# Patient Record
Sex: Male | Born: 1960
Health system: Western US, Academic
[De-identification: ages and names within clinical notes are randomized; demographics above are authoritative.]

---

## 2020-05-23 ENCOUNTER — Inpatient Hospital Stay: Payer: PRIVATE HEALTH INSURANCE

## 2020-05-23 DIAGNOSIS — C61 Malignant neoplasm of prostate: Secondary | ICD-10-CM

## 2020-05-28 ENCOUNTER — Telehealth: Payer: BLUE CROSS/BLUE SHIELD

## 2020-05-28 NOTE — Telephone Encounter
Intake complete.  Timothy Merritt 05/28/2020 10:03 AM

## 2020-05-29 NOTE — Consults
RADIATION ONCOLOGY PROSTATE DEFINITIVE NEW PATIENT VISIT NOTE    ???PATIENT: Timothy Merritt  ???MRN: [id number]  ???DOB: Feb 01, 1961    ???DATE OF SERVICE: 05/31/2020    ???REFERRING PRACTITIONER: Fraser Din, MD  ???PRIMARY CARE PROVIDER: No primary care provider on file.  ???RESIDENT PHYSICIAN: Carl Best, MD   ???ATTENDING PHYSICIAN: Casimiro Needle L. Ria Comment, MD     ???CHIEF COMPLAINT: ***    ???IDENTIFYING DATA: ***    ???STAGE: Cancer Staging  No matching staging information was found for the patient.     ???DATA OF PRESENT ILLNESS:  ???   Prostate Data Fields ~ Data ~ Comments    ??? Most Recent PSA level ~  ~     ??? Most Recent PSA level date (m/d/yyyy) ~  ~     ??? Biopsy date - most recent (m/d/yyyy) ~  ~     ??? Biopsy Gleason Score 1 ~  ~     ??? Biopsy Gleason Score 2 ~  ~     ??? Biopsy Gleason Score Total (1+2) ~  ~     ??? Total biopsy cores ~  ~     ??? Positive biopsy cores  ~  ~     ??? Bone Scan (recent) ~ {  :24337::''----''} ~     ??? MRI Pelvis (recent) ~ {  :24337::''----''} ~     ??? CT Scan Pelvis (recent) ~ {  :24337::''----''} ~  ???     Subjective:       History of Present Illness:     Timothy Merritt is a 59 y.o. male who was in his usual state of health when his PSA was found to be elevated to *** on ***.    Had HIFU (left hemi-ablation) on 03/17/2018.    Following HIFU, his PSA was found to be elevated to 4.94 on 11/04/2018.    By 07/07/2019, his PSA had risen to 10.45.    mpMRI performed at [facility] revealed:  Impression:  1. ???Post-treatment changes in the left posterior gland without evidence of residual disease in this location.  2. ???PI-RADS 5 lesion in the anterior transition zone at the base/mid gland extending into the anterior fibromuscular stroma. Volume of 3.27 mL. Broad-based capsular abutment anteriorly is concerning for microscopic extracapsular extension. Recommend correlation with outside prior MRI to  evaluate for interval change.  3. ???No lymphadenopathy or osseous metastatic disease in the pelvis.    MRI-guided fusion biopsy on 07/07/2019 at [facility] demonstrated Gleason 3+4=7 disease in 4/4 targeted cores, Gleason 3+4=7 in the right lateral base, and Gleason 3+3=6 disease in four additional bilateral cores.    mpMRI performed at another outside hospital on 04/07/2020 revealed:  1. No evidence high-grade prostate cancer local recurrence within the prostate gland.  2. No evidence of extracapsular local prostate cancer or prostate cancer metastasis within the pelvis.  3. Asymmetric volumes of the peripheral with volume loss and cystic change in the in the LEFT peripheral zone presumably related to prior HIFU therapy.    The scan was over-read at [facility] as follows: The scan was significantly limited in quality, but there appeared to be a large, invasive mass replacing most of the prostate gland, extending into the bilateral seminal vesicles, very suspicious for locally advanced prostate cancer, likely stage T3b.    Today in clinic, he reports ***. The patient denies frequency, urgency, dysuria, hematuria, urinary incontinence, loose stools, tenesmus, rectal bleeding, fatigue, or significant changes in erectile function.  CTCAE Pelvis    Review of Systems: A 14 point review of systems questionnaire was given to the patient and reviewed by the attending physician. Signed copies are available in the chart.    Current Outpatient Medications   Medication Sig   ??? rivaroxaban 10 mg tablet Take 10 mg by mouth.     No current facility-administered medications for this encounter.      No Known Allergies  PERTINENT PAST MEDICAL HISTORY:   Past Medical History:   Diagnosis Date   ??? Prostate cancer (HCC/RAF)    ??? Stroke (HCC/RAF) 2018     PAST SURGICAL HISTORY: History reviewed. No pertinent surgical history.   PREVIOUS MALIGNANCY: ***  PREVIOUS RADIATION THERAPY:***  PREVIOUS CHEMOTHERAPY: ***    Radiation Oncology 3Ps  Do you have a pacemaker or implantable defibrillator?: No  Is it possible that you are pregnant?: No  Have you previously received radiation treatment?: No    Family History   Problem Relation Age of Onset   ??? Skin cancer Father    ??? Liver cancer Brother      Social History     Tobacco Use   ??? Smoking status: Never Smoker   ??? Smokeless tobacco: Never Used   Substance Use Topics   ??? Alcohol use: Yes     Alcohol/week: 3.6 - 4.8 oz     Types: 6 - 8 Glasses of Wine (5 oz) per week   ??? Drug use: Not Currently       Marital Status: ***  Lives with: [employer] of Residence: ***  Occupation: ***  Able to perform activities of daily living: ***         Objective:      Physical Exam:  Ht 6' (1.829 m)  ~ Wt 171 lb (77.6 kg) Comment: Pt states ~ BMI 23.19 kg/m???      {ECOG - [facility] performance status:29211}     Physical Exam  Lab Review / Pathology / Radiology:    Biopsy 07/07/2019:  A. 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  Carcinoma involves 6 mm of 15 mm aggregate biopsy length.   Carcinoma involves 1 of 1 core.      B. Prostate, right lateral mid, needle core biopsy:  Prostatic adenocarcinoma, Gleason pattern 3 + 3 = 6 (Grade Group 1).  Carcinoma involves 2 mm of 14 mm aggregate biopsy length.   Carcinoma involves 1 of 1 core.      C. Prostate, right lateral apex, needle core biopsy:  Prostatic adenocarcinoma, Gleason pattern 3 + 3 = 6 (Grade Group 1).  Carcinoma involves 5 mm of 8 mm aggregate biopsy length.   Carcinoma involves 1 of 1 core.      D. Prostate, left lateral base, needle core biopsy:  Prostatic adenocarcinoma, Gleason pattern 3 + 3 = 6 (Grade Group 1).  Carcinoma involves 1 mm of 11 mm aggregate biopsy length.   Carcinoma involves 1 of 1 core.      E. Prostate, left lateral mid, needle core biopsy:  Prostatic adenocarcinoma, Gleason pattern 3 + 3 = 6 (Grade Group 1).  Carcinoma involves 1 mm of 11 mm aggregate biopsy length.   Carcinoma involves 1 of 1 core.      F. Prostate, left lateral apex, needle core biopsy: Benign prostate tissue.      G. Prostate, lesion 1, needle core  biopsy:  Prostatic adenocarcinoma, Gleason pattern 3 + 4 (10%) = 7 (Grade Group 2).  Carcinoma involves 9 mm of 69 mm aggregate biopsy length.   Carcinoma involves 4 of 4 cores.       Assessment:      ***     Plan/ Recommendation:      We reviewed the patient???s detailed clinical history, imaging and pathology.    We discussed the nature and rationale of radiotherapy for the treatment of prostate cancer. We reviewed the radiotherapy side effects that can occur during treatment and within a few months after completing treatment. These include but are not limited to mild fatigue, bladder and urethra irritation symptoms of frequency and urgency, mild burning with urination, a small risk of tiny amounts of bleeding in the urine, and the rectum and bowel irritation symptoms of slightly looser, urgent and more frequent bowel movements, and possible small amounts of bleeding from hemorrhoids. These side effects are generally mild, are easily managed during treatment, and gradually resolve within several weeks after completion of treatment. We discussed that there is a very small risk that some side effects can persist for many months but that they resolve in the vast majority of patients. We discussed that erectile dysfunction after radiotherapy can occur in up to half of patients within 5 years after treatment. We discussed that the risk of urinary incontinence or bowel incontinence are extremely rare.  Lastly, we reviewed the likelihood of treatment success with radiotherapy, as well as the potential alternative treatments and the consequences of not proceeding with any treatment.    {Select Low or Moderate or High MDM; and/or TIME. Time required for Non-Medicare:35252}           cc No care team member to display      Author: Carl Best 05/29/2020 12:22 PM

## 2020-05-31 ENCOUNTER — Telehealth: Payer: BLUE CROSS/BLUE SHIELD

## 2020-06-01 ENCOUNTER — Inpatient Hospital Stay: Payer: BLUE CROSS/BLUE SHIELD | Attending: Radiation Oncology
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
## 2020-06-08 ENCOUNTER — Inpatient Hospital Stay: Payer: BLUE CROSS/BLUE SHIELD | Attending: Radiation Oncology

## 2020-06-08 DIAGNOSIS — C61 Malignant neoplasm of prostate: Secondary | ICD-10-CM

## 2020-06-08 NOTE — Addendum Note
Encounter addended by: Joycelyn Das., MD on: 06/08/2020 1:28 PM   Actions taken: Clinical Note Signed, Charge Capture section accepted

## 2020-06-15 ENCOUNTER — Telehealth: Payer: BLUE CROSS/BLUE SHIELD

## 2020-06-15 ENCOUNTER — Ambulatory Visit: Payer: PRIVATE HEALTH INSURANCE | Attending: Radiation Oncology

## 2020-06-15 NOTE — Telephone Encounter
Intake complete.  Timothy Merritt 06/15/2020 2:06 PM

## 2020-06-15 NOTE — Progress Notes
***THIS NOTE IS INCOMPLETE***    RADIATION ONCOLOGY PROSTATE FOLLOW-UP VISIT NOTE    ???PATIENT: Timothy Merritt  ???MRN: [id number]  ???DOB: 08/02/1961     ???DATE OF SERVICE: 06/15/2020    ???REFERRING PRACTITIONER: Fraser Din, MD  ???PRIMARY CARE PROVIDER: No primary care provider on file.  ???RESIDENT PHYSICIAN: Elenore Paddy. Murrell Converse, MD  ???ATTENDING PHYSICIAN: Casimiro Needle L. Ria Comment, MD     ???CHIEF COMPLAINT: 1 week follow-up    ???IDENTIFYING DATA/ONCOLOGIC HISTORY: Timothy Merritt is a 59 y.o. 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 PSA decreased to 4.94 on 11/04/2018 but rose to 10.45 on 07/07/2019, with repeat MRI fusion biopsy on 07/07/2019 showing up to GS 3+4=7 disease (total of 5/6 systematic cores and 4/4 targeted cores positive), continued on surveillance. Outside MRI Prostate 04/09/2020 limited in quality but suggestive of a large invasive mass replacing most of prostate with bilateral SVI, likely T3b.      ???RADIATION HISTORY: ***    ???DATA OF INTERVAL HISTORY:  ??? Prostate-FU Data Fields ~ Data ~ Comments    ??? Most recent PSA level ~  ~     ??? Recent PSA date (m/d/yyyy) ~  ~     ??? Biochemical Failure (BF) ~ {failure:21200::''----''} ~     ??? If BF, date (m/d/yyyy) ~  ~     ??? Local Failure (LF) ~ {local failure:21020805::''----''} ~     ??? If LF, date (m/d/yyyy) ~  ~     ??? Regional failure (RF) ~ {regional failure:21020806::''----''} ~     ??? If RF, date (m/d/yyyy) ~  ~     ??? Distant Mets (DM) ~ { :21020807::''----''} ~     ??? If DM, date (m/d/yyyy) ~  ~     ??? Any failure within RT field? ~ { :24281::''----''} ~     ??? RT related complications ~ { :24281::''----''} ~     ??? Complications ~  ~     ??? Highest complication grade (CTCAE) ~ {0-5:24282::''----''} ~  ???     Subjective:       Interval History: Timothy Merritt comes for a 1 week follow-up to discuss MRI prostate results from 06/12/2020.     His oncologic course is outlined as below:    He was in his usual state of health when his PSA was found to be elevated to 9.1 on 04/03/2017. 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  ???  Underwent TRUS-guided prostate biopsy on 05/05/2017 showing GS 3+4=7 in 1 core (LB, 9% involvement) and GS 3+3=6 in 3 cores (LM 6%, LA 6%, LLM 3%), no PNI.  ???  On 05/25/2017, prostate MRI showed 38.3 g prostate, LB lesion, RA lesion, no ECE.  ???  Had HIFU (left hemi-ablation) on 03/17/2018 at [facility].  ???  Following HIFU, his PSA was found to be elevated to 4.94 on 11/04/2018.  ???  mpMRI performed at [facility] on 11/29/2018 revealed:  Impression:  1. ???Post-treatment changes in the left posterior gland without evidence of residual disease in this location.  2. ???PI-RADS 5 lesion in the anterior transition zone at the base/mid gland extending into the anterior fibromuscular stroma. Volume of 3.27 mL. Broad-based capsular abutment anteriorly is concerning  for microscopic extracapsular extension. Recommend correlation with outside prior MRI to evaluate for interval change.  3. ???No lymphadenopathy or osseous metastatic disease in the pelvis.  ???  By 07/07/2019, his PSA had risen to 10.45.  ???  MRI-guided fusion biopsy on 07/07/2019 at [facility] demonstrated Gleason 3+4=7 disease in 4/4 targeted cores, Gleason 3+4=7 in the right lateral base, and Gleason 3+3=6 disease in four additional bilateral cores. Patient elected to continue surveillance.  ???  mpMRI performed at another outside hospital on 04/09/2020 revealed:  1. No evidence high-grade prostate cancer local recurrence within the prostate gland.  2. No evidence of extracapsular local prostate cancer or prostate cancer metastasis within the pelvis.  3. Asymmetric volumes of the peripheral with volume loss and cystic change in the in the LEFT peripheral zone presumably related to prior HIFU therapy.  ???  The scan was re-read at [facility] as follows: The scan was significantly limited in quality, but there appeared to be a large, invasive mass replacing most of the prostate gland, extending into the bilateral seminal vesicles, very suspicious for locally advanced prostate cancer, likely stage T3b.  ???  PSA on 01/12/20 was 11.5.   ???  On 05/24/20, seen by Dr. Fabio Asa. Recommend SBRT and rx'd cipro for prostatitis. Unknown plan about ADT.     ?????????????????????????????????????????????????????????????????????????????????????????????????????????????????????????????????????????????????????????????????????????????????????????????????????????????????????????????????????????????    He had an MRI prostate at [facility] 3 days ago. ***  ???  His last colonoscopy was 3 years ago in 2018 which revealed 2 benign polyps. He lives in [place], but willing to come to [facility] for treatment.    IPSS Questionnaire (AUA-7):  Over the past month???    1)  How often have you had a sensation of not emptying your bladder completely after you finish urinating?  {Rating:19227}   2)  How often have you had to urinate again less than two hours after you finished urinating? {Rating:19227}   3)  How often have you found you stopped and started again several times when you urinated?  {Rating:19227}   4) How difficult have you found it to postpone urination?  {Rating:19227}   5) How often have you had a weak urinary stream?  {Rating:19227}   6) How often have you had to push or strain to begin urination?  {Rating:19227}   7) How many times did you most typically get up to urinate from the time you went to bed until the time you got up in the morning?  {Rating:19228}   Total score:  0-7 mildly symptomatic    8-19 moderately symptomatic    20-35 severely symptomatic     SHIM Score:  ???      Current Toxicity  Anorexia: Grade 0  Anxiety: Grade 0  Bloating: Grade 0  Constipation: Grade 0  Dermatitis Radiation: Grade 0  Diarrhea: Grade 0  Fatigue: Grade 0  Fecal Incontinence: Grade 0  Flatulence: Grade 0  Nausea: Grade 0  Pain: Grade 0  Skin Hyperpigmentation: Grade 0  Urinary Frequency: Grade 0  Urinary Incontinence: Grade 0  Urinary Tract Pain: Grade 0  Urinary Urgency: Grade 0  Vomiting: Grade 0  Concurrent Therapy   No biological treatment.  No chemotherapy treatment.       Objective:      Physical Exam:  There were no vitals taken for this visit. NA-TELEMEDICINE    ECOG - [place] Cooperative Oncology Group performance status: ECOG Score - 0 - Fully active, able to carry on all pre-disease performance without  restriction.      Physical Exam NA-TELEMEDICINE    Lab / Pathology / Radiology:   None     Assessment:      Timothy Merritt is a 59 y.o. 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 PSA decreased to 4.94 on 11/04/2018 but rose to 10.45 on 07/07/2019, with repeat MRI fusion biopsy on 07/07/2019 showing up to GS 3+4=7 disease (total of 5/6 systematic cores and 4/4 targeted cores positive), continued on surveillance. Outside MRI Prostate 04/09/2020 limited in quality but suggestive of a large invasive mass replacing most of prostate with bilateral SVI, likely T3b.       Plan/ Recommendation:      Next follow-up: ***  Next PSA: ***  Other MD follow-up: Dr. Fabio Asa    Low level of Medical Decision Making: (at least 2 of below)  [x]  Low complexity of problems addressed. There were no encounter diagnoses. This is a stable chronic illness.  [x]  Limited amount/complexity of data personally reviewed and analyzed by me (at least 1 of below)   ? [x]  Category 1 (tests, documents): review of test results (MRI scan [dates: 06/12/2020])  ? []  Category 2: assessment requiring an independent historian  [x]  Low (but more than minimal) risk of complications/morbidity of additional testing or treatment           cc No care team member to display      Author: Elenore Paddy. Murrell Converse 06/14/2020 7:11 PM

## 2020-06-19 NOTE — Progress Notes
RADIATION ONCOLOGY PROSTATE FOLLOW-UP VISIT NOTE    ???PATIENT: Timothy Merritt  ???MRN: [id number]  ???DOB: [birth year]     ???DATE OF SERVICE: 06/20/2020    ???REFERRING PRACTITIONER: Fraser Din, MD  ???PRIMARY CARE PROVIDER: No primary care provider on file.  ???RESIDENT PHYSICIAN: Elenore Paddy. Murrell Converse, MD  ???ATTENDING PHYSICIAN: Casimiro Needle L. Ria Comment, MD     Patient Consent to Telehealth   The patient agreed to participate in the video visit prior to joining the visit.      ???CHIEF COMPLAINT: 2 week follow-up    ???IDENTIFYING DATA/ONCOLOGIC HISTORY: Timothy Merritt is a 59 y.o. 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 PSA decreased to 4.94 on 11/04/2018 but rose to 10.45 on 07/07/2019, with repeat MRI fusion biopsy on 07/07/2019 showing up to GS 3+4=7 disease (total of 5/6 systematic cores and 4/4 targeted cores positive), continued on surveillance. MRI prostate from 06/12/2020 revealed involvement of the left seminal vesicle involvement and right NV bundle. Overall concerning for high-risk disease (iT3b, iPSA 9.1, GS 3+4 = 7).       ???RADIATION HISTORY: None    Subjective:       Interval History: Timothy Merritt comes for a 2 week follow-up to discuss MRI prostate results from 06/12/2020.     His oncologic course is outlined as below:    He was in his usual state of health when his PSA was found to be elevated to 9.1 on 04/03/2017. 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  ???  Underwent TRUS-guided prostate biopsy on 05/05/2017 showing GS 3+4=7 in 1 core (LB, 9% involvement) and GS 3+3=6 in 3 cores (LM 6%, LA 6%, LLM 3%), no PNI.  ???  On 05/25/2017, prostate MRI showed 38.3 g prostate, LB lesion, RA lesion, no ECE.  ???  Had HIFU (left hemi-ablation) on 03/17/2018 at [facility].  ???  Following HIFU, his PSA was found to be elevated to 4.94 on 11/04/2018.  ???  mpMRI performed at [facility] on 11/29/2018 revealed:  Impression:  1. ???Post-treatment changes in the left posterior gland without evidence of residual disease in this location.  2. ???PI-RADS 5 lesion in the anterior transition zone at the base/mid gland extending into the anterior fibromuscular stroma. Volume of 3.27 mL. Broad-based capsular abutment anteriorly is concerning for microscopic extracapsular extension. Recommend correlation with outside prior MRI to evaluate for interval change.  3. ???No lymphadenopathy or osseous metastatic disease in the pelvis.  ???  By 07/07/2019, his PSA had risen to 10.45.  ???  MRI-guided fusion biopsy on 07/07/2019 at [facility] demonstrated Gleason 3+4=7 disease in 4/4 targeted cores, Gleason 3+4=7 in the right lateral base, and Gleason 3+3=6 disease in four additional bilateral cores. Patient elected to continue surveillance.  ???  mpMRI performed at another outside hospital on 04/09/2020 revealed:  1. No evidence high-grade prostate cancer local recurrence within the prostate gland.  2. No evidence of extracapsular local prostate cancer or prostate cancer metastasis within the pelvis.  3. Asymmetric volumes of the peripheral with volume loss and cystic change in the in the LEFT peripheral zone presumably related to prior HIFU therapy.  ???  The scan was re-read at [facility] as follows: The scan was  significantly limited in quality, but there appeared to be a large, invasive mass replacing most of the prostate gland, extending into the bilateral seminal vesicles, very suspicious for locally advanced prostate cancer, likely stage T3b.  ???  PSA on 01/12/20 was 11.5.   ???  On 05/24/20, seen by Dr. Fabio Asa. Recommend SBRT and rx'd cipro for prostatitis. Unknown plan about ADT.     ?????????????????????????????????????????????????????????????????????????????????????????????????????????????????????????????????????????????????????????????????????????????????????????????????????????????????????????????????????????????    He had an MRI prostate at [facility] a week ago found a 21.47cc prostate with increased size of lesion centered in the anterior transitional zone with involvement of the fibromuscular stroma extending from the base to apex, now measuring 3.8 x 1.2 x 1.5cm  With broad-based capsular abutment and questionable involvement of the R neurovascular bundle. The lesion involves the left seminal vesicle. No suspicious pelvic lymphadenopathy were identified.   ???  His last colonoscopy was 3 years ago in 2018 which revealed 2 benign polyps. He lives in [place], but willing to come to [facility] for treatment, though only has 1 week off from work for treatment.    Today, he is doing well. He met with Dr. Fabio Asa yesterday, and a prescription for Casodex was sent yesterday. He has not yet looked into systemic staging scans.        Current Toxicity  Anorexia: Grade 0  Anxiety: Grade 0  Bloating: Grade 0  Constipation: Grade 0  Dermatitis Radiation: Grade 0  Diarrhea: Grade 0  Fatigue: Grade 0  Fecal Incontinence: Grade 0  Flatulence: Grade 0  Nausea: Grade 0  Pain: Grade 0  Skin Hyperpigmentation: Grade 0  Urinary Frequency: Grade 0  Urinary Incontinence: Grade 0  Urinary Tract Pain: Grade 0  Urinary Urgency: Grade 0  Vomiting: Grade 0  Concurrent Therapy   No biological treatment.  No chemotherapy treatment.       Objective:      Physical Exam:  Ht 6' (1.829 m)  ~ Wt 175 lb (79.4 kg) Comment: Pt states ~ BMI 23.73 kg/m???  NA-TELEMEDICINE    ECOG - [place] Cooperative Oncology Group performance status: ECOG Score - 0 - Fully active, able to carry on all pre-disease performance without restriction.      Physical Exam NA-TELEMEDICINE    Lab / Pathology / Radiology:       MRI Prostate- 06/12/2020    Findings:    The prostate gland measures: 4.1 x 3.3 x 3.4 cm. Prostate volume 21.47 mL.  Most recent PSA: 10.45 ng/mL. 07/07/2019.  PSA density: 0.4 ng/mL/mL. (Most recent PSA value is from 07/07/2019 and  therefore this is unlikely to be accurate)    Peripheral zone: Posttreatment changes in the left posterior gland with  associated multiple cystic areas. No increased perfusion in this location.    Central gland: Increased size of previously identified lesion centered in  the anterior transitional zone with involvement of the anterior  fibromuscular stroma extending from the base to the apex, now with greater  extension to the posterior transitional zone and right peripheral zone. The  lesion measures 3.8 x 1.2 x 1.5 cm with volume 6.32 mL. The lesion  demonstrates T2 signal hypointensity, restricted diffusion and hypointense  signal on ADC with broad-based capsular abutment.    Prostatic capsule: Broad-based capsular abutment.    Neurovascular bundles: There is abutment with questionable involvement of  the right neurovascular bundle.    Seminal vesicles: The lesion involves the left seminal vesicle.Marland Kitchen    Lymph nodes: No lymphadenopathy.    Bones:  No aggressive appearing osseous lesions.    Extraprostatic findings: Small bilateral fat-containing inguinal hernias.    Impression:  Increased size of PI-RADS 5 ???lesion in the anterior transition zone, now  extending posteriorly into the right transition zone and peripheral zone,  from the base of prostate to the apex. Volume 6.32 mL. There is broad-based  capsular abutment anteriorly and posteriorly concerning for microscopic  extracapsular extension. There is likely involvement of the right  neurovascular bundle and left seminal vesicle.    Post-treatment changes in the left posterior gland without evidence of  residual disease in this location.    No lymphadenopathy or osseous metastatic disease identified.     Assessment:      Timothy Merritt is a 59 y.o. 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 PSA decreased to 4.94 on 11/04/2018 but rose to 10.45 on 07/07/2019, with repeat MRI fusion biopsy on 07/07/2019 showing up to GS 3+4=7 disease (total of 5/6 systematic cores and 4/4 targeted cores positive), continued on surveillance. MRI prostate from 06/12/2020 revealed involvement of the left seminal vesicle involvement and right NV bundle. Overall concerning for high-risk disease (iT3b, iPSA 9.1, GS 3+4 = 7).      Plan/ Recommendation:      - Complete systemic staging with CT CAP + NM Bone scan vs. PSMA scan. This can be done either locally or in [place]. Patient will look into systemic staging locally.  - We discussed with patient that CT simulation and SBRT will be completed over minimum ~2 weeks which logistically will be difficult to do with his employment obligations. We recommend radiation therapy +/- brachytherapy or SBRT which can be performed at [facility]. Patient tentatively prefers SBRT in [place] delivered over 1 week. We discussed the higher risk for developing acute radiation-associated toxicities when delivered over 1 week.   - Start ADT (Casodex) with Dr. Fabio Asa    We discussed the nature and rationale of radiotherapy for the treatment of prostate cancer. We reviewed the radiotherapy side effects that can occur during treatment and within a few months after completing treatment. These include but are not limited to mild fatigue, bladder and urethra irritation symptoms of frequency and urgency, mild burning with urination, a small risk of tiny amounts of bleeding in the urine, and the rectum and bowel irritation symptoms of slightly looser, urgent and more frequent bowel movements, and possible small amounts of bleeding from hemorrhoids. These side effects are generally mild, are easily managed during treatment, and gradually resolve within several weeks after completion of treatment. We discussed that there is a very small risk that some side effects can persist for many months but that they resolve in the vast majority of patients. We discussed that erectile dysfunction after radiotherapy can occur in up to half of patients within 5 years after treatment. We discussed that the risk of urinary incontinence or bowel incontinence are extremely rare.  Lastly, we reviewed the likelihood of treatment success with radiotherapy, as well as the potential alternative treatments and the consequences of not proceeding with any treatment.    Jackqulyn Livings, M.D.  Resident Physician, PGY-5  [facility]      High level of Medical Decision Making: (at least 2 of below)  [x]  High complexity of problems addressed. Malignant neoplasm is an illness/condition that poses a  threat to life or bodily function.  [x]  Extensive amount/complexity of data personally reviewed and analyzed by me (at least 2 of below)   ? [x]  Category 1 (tests, documents, independent historians): review of prior external notes [providers/dates: 06/19/2020], review of test results (MRI scan [dates: 06/12/2020]), ordering of tests (CT scan, bone scan)  ? [x]  Category 2 (independent interpretation of tests): MRI scan [dates: 06/12/2020]  ? [x]  Category 3 (discussion of management or test interpretation): referring provider  [x]  High risk of complications/morbidity/mortality of treatment due to inherent nature of radiation therapy and intensive monitoring for radiation therapy toxicity, with or without chemotherapy    Total time: I spent 45 minutes on the day of service, which included:  [x]  Face-to-face and non-face-to-face time spent with the patient  [x]  Preparing to see the patient (e.g. review of tests, imaging)  [x]  Obtaining and/or reviewing separately obtained history  [x]  Performing a medically appropriate exam and evaluation  [x]  Counseling and education with the patient and family/caregiver  [x]  Ordering medications, tests, procedures  [x]  Referring or communicating with other healthcare professionals  [x]  Documenting clinical information in the EHR  [x]  Independently interpreting results and communicating results to patient/family/caregiver           cc No care team member to display      Author: Elenore Paddy. Murrell Converse 06/19/2020 1:35 PM     Addendum: I attended with the resident physician the patient's follow-up care visit. I have seen and examined the patient. I discussed with the patient my physical findings and the follow-up care plan. I have reviewed and electronically signed the resident???s report and agree with the documented findings and plan of care.  By:  Marolyn Hammock. Eriana Suliman 06/23/2020 1:40 PM

## 2020-06-20 ENCOUNTER — Inpatient Hospital Stay: Payer: BLUE CROSS/BLUE SHIELD | Attending: Radiation Oncology

## 2020-06-20 ENCOUNTER — Ambulatory Visit: Payer: BLUE CROSS/BLUE SHIELD

## 2020-06-20 DIAGNOSIS — C61 Malignant neoplasm of prostate: Secondary | ICD-10-CM

## 2020-06-21 ENCOUNTER — Ambulatory Visit: Payer: BLUE CROSS/BLUE SHIELD

## 2020-06-23 NOTE — Addendum Note
Encounter addended by: Joycelyn Das., MD on: 06/23/2020 1:41 PM   Actions taken: Clinical Note Signed

## 2020-06-25 ENCOUNTER — Ambulatory Visit: Payer: PRIVATE HEALTH INSURANCE

## 2020-07-12 NOTE — Telephone Encounter
Please obtain records from [facility] per pt's request. Thank you.

## 2020-07-18 ENCOUNTER — Ambulatory Visit: Payer: PRIVATE HEALTH INSURANCE

## 2020-07-23 ENCOUNTER — Ambulatory Visit: Payer: BLUE CROSS/BLUE SHIELD

## 2020-07-27 ENCOUNTER — Telehealth: Payer: BLUE CROSS/BLUE SHIELD

## 2020-07-27 NOTE — Telephone Encounter
Rad-onc intake questions complete.

## 2020-07-30 ENCOUNTER — Telehealth: Payer: BLUE CROSS/BLUE SHIELD

## 2020-07-30 ENCOUNTER — Ambulatory Visit: Payer: PRIVATE HEALTH INSURANCE

## 2020-07-31 ENCOUNTER — Inpatient Hospital Stay: Payer: BLUE CROSS/BLUE SHIELD | Attending: Radiation Oncology

## 2020-07-31 DIAGNOSIS — C61 Malignant neoplasm of prostate: Secondary | ICD-10-CM

## 2020-07-31 NOTE — Progress Notes
RADIATION ONCOLOGY PROSTATE FOLLOW-UP TELEPHONE VISIT NOTE    ???PATIENT: Timothy Merritt  ???MRN: [id number]  ???DOB: Apr 17, 1961     ???DATE OF SERVICE: 07/31/2020    ???REFERRING PRACTITIONER: Fraser Din, MD  ???PRIMARY CARE PROVIDER: No primary care provider on file.  ???RESIDENT PHYSICIAN: Clayton P. Katrinka Blazing, MD  ???ATTENDING PHYSICIAN: Casimiro Needle L. Ria Comment, MD     Patient Consent to Telehealth   The patient agreed to participate in the video visit prior to joining the visit.      ???CHIEF COMPLAINT: 2 week follow-up    ???IDENTIFYING DATA/ONCOLOGIC HISTORY: Timothy Merritt is a 59 y.o. 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 PSA decreased to 4.94 on 11/04/2018 but rose to 10.45 on 07/07/2019, with repeat MRI fusion biopsy on 07/07/2019 showing up to GS 3+4=7 disease (total of 5/6 systematic cores and 4/4 targeted cores positive), continued on surveillance. MRI prostate from 06/12/2020 revealed involvement of the left seminal vesicle involvement and right NV bundle. Overall concerning for high-risk disease (iT3b, iPSA 9.1, GS 3+4 = 7).       ???RADIATION HISTORY: None    Subjective:       Interval History: Timothy Merritt comes for a pre treatment follow up.    His oncologic course is outlined as below:    He was in his usual state of health when his PSA was found to be elevated to 9.1 on 04/03/2017. 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  ???  Underwent TRUS-guided prostate biopsy on 05/05/2017 showing GS 3+4=7 in 1 core (LB, 9% involvement) and GS 3+3=6 in 3 cores (LM 6%, LA 6%, LLM 3%), no PNI.  ???  On 05/25/2017, prostate MRI showed 38.3 g prostate, LB lesion, RA lesion, no ECE.  ???  Had HIFU (left hemi-ablation) on 03/17/2018 at [facility].  ???  Following HIFU, his PSA was found to be elevated to 4.94 on 11/04/2018.  ???  mpMRI performed at [facility] on 11/29/2018 revealed:  Impression:  1. ???Post-treatment changes in the left posterior gland without evidence of residual disease in this location.  2. ???PI-RADS 5 lesion in the anterior transition zone at the base/mid gland extending into the anterior fibromuscular stroma. Volume of 3.27 mL. Broad-based capsular abutment anteriorly is concerning for microscopic extracapsular extension. Recommend correlation with outside prior MRI to evaluate for interval change.  3. ???No lymphadenopathy or osseous metastatic disease in the pelvis.  ???  By 07/07/2019, his PSA had risen to 10.45.  ???  MRI-guided fusion biopsy on 07/07/2019 at [facility] demonstrated Gleason 3+4=7 disease in 4/4 targeted cores, Gleason 3+4=7 in the right lateral base, and Gleason 3+3=6 disease in four additional bilateral cores. Patient elected to continue surveillance.  ???  mpMRI performed at another outside hospital on 04/09/2020 revealed:  1. No evidence high-grade prostate cancer local recurrence within the prostate gland.  2. No evidence of extracapsular local prostate cancer or prostate cancer metastasis within the pelvis.  3. Asymmetric volumes of the peripheral with volume loss and cystic change in the in the LEFT peripheral zone presumably related to prior HIFU therapy.  ???  The scan was re-read at [facility] as follows: The scan was significantly limited in quality, but there  appeared to be a large, invasive mass replacing most of the prostate gland, extending into the bilateral seminal vesicles, very suspicious for locally advanced prostate cancer, likely stage T3b.  ???  PSA on 01/12/20 was 11.5.   ???  On 05/24/20, seen by Dr. Fabio Asa. Recommend SBRT and rx'd cipro for prostatitis. Unknown plan about ADT.     He had an MRI prostate at [facility] a week ago found a 21.47cc prostate with increased size of lesion centered in the anterior transitional zone with involvement of the fibromuscular stroma extending from the base to apex, now measuring 3.8 x 1.2 x 1.5cm  With broad-based capsular abutment and questionable involvement of the R neurovascular bundle. The lesion involves the left seminal vesicle. No suspicious pelvic lymphadenopathy were identified.   ???  His last colonoscopy was 3 years ago in 2018 which revealed 2 benign polyps. He lives in [place], but willing to come to [facility] for treatment, though only has 1 week off from work for treatment.    He underwent NM Bone scan and CT C/A/P w con on 07/05/20, which was negative for metastatic disease. He continues to be on casodex for ADT.    Today, he is doing well. He is interested in proceeding with radiotherapy treatment here at [facility].      Current Toxicity  Concurrent Therapy   No biological treatment.  No chemotherapy treatment.       Objective:      Physical Exam:  Ht 6' (1.829 m) Comment: stated ~ Wt 175 lb (79.4 kg) Comment: stated ~ BMI 23.73 kg/m???  NA-TELEMEDICINE    ECOG - [place] Cooperative Oncology Group performance status: ECOG Score - 0 - Fully active, able to carry on all pre-disease performance without restriction.      Physical Exam NA-TELEMEDICINE    Lab / Pathology / Radiology:       NM Bone Scan 07/05/20        CT C/A/P w con 06/12/20:  Findings:  Chest: ???  - Chest wall and Thoracic Inlet: No masses or lymphadenopathy.    - Mediastinum and Hila: No masses or lymphadenopathy.    - Thoracic Vessels: Normal caliber of the thoracic aorta and main pulmonary  artery.    - Heart and Pericardium: Normal heart size. ???No pericardial effusion.  Minimal coronary artery calcifications.     - Lungs and Airways: No suspicious nodules or opacities.    - Pleura: No pleural effusions.       Abdomen and pelvis:   - Liver: Normal in morphology and enhancement. ???No suspicious hepatic  masses are identified. ???The portal and hepatic veins are patent.     - Biliary and Gallbladder: No intrahepatic or extrahepatic bile duct  dilatation. Unremarkable gallbladder.    - Pancreas: Normal in appearance.     - Spleen: Normal in appearance. ???    - Adrenal Glands: Normal in appearance.     - Kidneys: Symmetric enhancement of the bilateral kidneys. No suspicious  renal lesions. No hydronephrosis.    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    - Pelvic Organs: Unremarkable.    - Gastrointestinal Tract: No abnormal dilation or wall thickening. Colonic  diverticulosis.    - Abdominal and Pelvic Vasculature: Minimal calcification of the abdominal  aorta.    - Lymph Nodes: No retroperitoneal, mesenteric, pelvic, or inguinal  lymphadenopathy. ???    - Peritoneum/Mesentery/Retroperitoneum: No free fluid. ???No free  intraperitoneal air.    - Body Wall: Unremarkable.    - Musculoskeletal: ???No aggressive appearing osseous lesions.      Impression:  No evidence of metastatic disease within the chest abdomen or pelvis.    Electronically Reviewed by: ???Tyler Aas, MD, [facility]  Electronically Reviewed on: ???07/05/2020 11:29 AM    I have reviewed the images and concur with the above findings.    Electronically Signed by: ???Morton Stall, MD, [facility]  Electronically Signed on: ???07/05/2020 11:37 AM    MRI Prostate- 06/12/2020    Findings:    The prostate gland measures: 4.1 x 3.3 x 3.4 cm. Prostate volume 21.47 mL.  Most recent PSA: 10.45 ng/mL. 07/07/2019.  PSA density: 0.4 ng/mL/mL. (Most recent PSA value is from 07/07/2019 and  therefore this is unlikely to be accurate)    Peripheral zone: Posttreatment changes in the left posterior gland with  associated multiple cystic areas. No increased perfusion in this location.    Central gland: Increased size of previously identified lesion centered in  the anterior transitional zone with involvement of the anterior  fibromuscular stroma extending from the base to the apex, now with greater  extension to the posterior transitional zone and right peripheral zone. The  lesion measures 3.8 x 1.2 x 1.5 cm with volume 6.32 mL. The lesion  demonstrates T2 signal hypointensity, restricted diffusion and hypointense  signal on ADC with broad-based capsular abutment.    Prostatic capsule: Broad-based capsular abutment.    Neurovascular bundles: There is abutment with questionable involvement of  the right neurovascular bundle.    Seminal vesicles: The lesion involves the left seminal vesicle.Marland Kitchen    Lymph nodes: No lymphadenopathy.    Bones: No aggressive appearing osseous lesions.    Extraprostatic findings: Small bilateral fat-containing inguinal hernias.    Impression:  Increased size of PI-RADS 5 ???lesion in the anterior transition zone, now  extending posteriorly into the right transition zone and peripheral zone,  from the base of prostate to the apex. Volume 6.32 mL. There is broad-based  capsular abutment anteriorly and posteriorly concerning for microscopic  extracapsular extension. There is likely involvement of the right  neurovascular bundle and left seminal vesicle.    Post-treatment changes in the left posterior gland without evidence of  residual disease in this location.    No lymphadenopathy or osseous metastatic disease identified.     Assessment:      Daishon Devaul Hout is a 59 y.o. 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 PSA decreased to 4.94 on 11/04/2018 but rose to 10.45 on 07/07/2019, with repeat MRI fusion biopsy on 07/07/2019 showing up to GS 3+4=7 disease (total of 5/6 systematic cores and 4/4 targeted cores positive), continued on surveillance. MRI prostate from 06/12/2020 revealed involvement of the left seminal vesicle involvement and right NV bundle. Overall concerning for high-risk disease (iT3b, iPSA 9.1, GS 3+4 = 7).      Plan/ Recommendation:      Mr. Talley presents with high risk prostate cancer now with staging imaging completed, negative for regional or distant metastatic disease. He would like to proceed with SBRT here at [facility]. We discussed with him that he can undergo treatment with either MR guided SBRT or CT guided SBRT. He has elected to proceed with MR guided SBRT.    We reviewed the patient's detailed clinical  history, imaging and pathology. We discussed with Mr. Beighley that he has high-risk prostate cancer. Published data from a subset of men who were prospectively for high-risk prostate cancer (albeit, high-risk for high T stage or high PSA, rather than high GS) have found comparable prostate cancer specific mortality for patients treated with radical prostatectomy or radiotherapy (Eur Urol. 2017 Mar;71(3):381-388.).     Workup: No additional workup is needed at this time. His recent staging imaging was negative for distant or regional metastatic disease.     Radiation Modality: Regarding radiation therapy, the most established options are dose-escalated external beam radiotherapy (EBRT) and extremely dose-escalated radiotherapy, typically EBRT with a brachytherapy boost (EBRT+BT). Mr. Hamernik would like to proceed with SBRT alone. Specifically, we would recommend he be treated on the MR guided linear accelerator, which allows [place] to adapt the treatment using MRI for each fraction.     As of March 2020, SBRT is now also considered a standard option for patients with high-risk disease. We have long treated patients with high-risk disease on a clinical trial ([email or web address]). Such patients were enrolled on HYPO-RT-PC Ralph Dowdy et al., Lancet 2019).     Hormonal Therapy: We recommend the patient be treated with long term ADT, although this will be managed by his medical oncologist Dr. Fabio Asa. He is currently on casodex.     We discussed the nature and rationale of radiotherapy for the treatment of prostate cancer. We reviewed the radiotherapy side effects that can occur during treatment and within a few months after completing treatment. These include but are not limited to mild fatigue, bladder and urethra irritation symptoms of frequency and urgency, mild burning with urination, a small risk of tiny amounts of bleeding in the urine, and the rectum and bowel irritation symptoms of slightly looser, urgent and more frequent bowel movements, and possible small amounts of bleeding from hemorrhoids. These side effects are generally mild, are easily managed during treatment, and gradually resolve within several weeks after completion of treatment. Brolin Devaul Tienken is a not a candidate for spaceOAR given his iT3b disease. We discussed that there is a very small risk that some side effects can persist for many months but that they resolve in the vast majority of patients. We discussed that erectile dysfunction after radiotherapy can occur in up to half of patients within 5 years after treatment. We discussed that the risk of urinary incontinence or bowel incontinence are extremely rare.  Lastly, we reviewed the likelihood of treatment success with radiotherapy, as well as the potential alternative treatments and the consequences of not proceeding with any treatment.     Plan:  - MR and CT Sim  - SBRT to the prostate, seminal vesicles and pelvic lymph nodes utilizing MRI guided treatment  - Not a candidate for SpaceOAR given his T3b disease    Grace Isaac, M.D.  Resident Physician, PGY-3  [facility]      High level of Medical Decision Making: (at least 2 of below)  [x]  High complexity of problems addressed. Malignant neoplasm is an illness/condition that poses a threat to life or bodily function.  [x]  Extensive amount/complexity of data personally reviewed and analyzed by me (at least 2 of below)   ??? [x]  Category 1 (tests, documents, independent historians): review of prior external notes [providers/dates: 06/19/2020], review of test results (MRI scan [dates: 06/12/2020]), ordering of tests (CT scan, bone scan)  ??? [x]  Category 2 (independent interpretation of tests): MRI scan [dates: 06/12/2020]  ??? [x]  Category  3 (discussion of management or test interpretation): referring provider  [x]  High risk of complications/morbidity/mortality of treatment due to inherent nature of radiation therapy and intensive monitoring for radiation therapy toxicity, with or without chemotherapy    Total time: I spent 45 minutes on the day of service, which included:  [x]  Face-to-face and non-face-to-face time spent with the patient  [x]  Preparing to see the patient (e.g. review of tests, imaging)  [x]  Obtaining and/or reviewing separately obtained history  [x]  Performing a medically appropriate exam and evaluation  [x]  Counseling and education with the patient and family/caregiver  [x]  Ordering medications, tests, procedures  [x]  Referring or communicating with other healthcare professionals  [x]  Documenting clinical information in the EHR  [x]  Independently interpreting results and communicating results to patient/family/caregiver           cc Patient Care Team:  Fraser Din, MD (Medicine, Hematology & Oncology)  Altamese Carolina., MD as Consult - Attending (Radiation Oncology)      Author: Rico Sheehan. Katrinka Blazing 07/30/2020 10:02 PM     Addendum: I attended with the resident physician the patient's follow-up care visit. I have seen and examined the patient. I discussed with the patient my physical findings and the follow-up care plan. I have reviewed and electronically signed the resident???s report and agree with the documented findings and plan of care.  By:  Marolyn Hammock. Riona Lahti 08/03/2020 11:57 AM

## 2020-08-01 ENCOUNTER — Ambulatory Visit: Payer: BLUE CROSS/BLUE SHIELD

## 2020-08-02 ENCOUNTER — Telehealth: Payer: BLUE CROSS/BLUE SHIELD

## 2020-08-03 NOTE — Addendum Note
Encounter addended by: Joycelyn Das., MD on: 08/03/2020 11:58 AM   Actions taken: Clinical Note Signed, Level of Service modified

## 2020-08-03 NOTE — Telephone Encounter
I left message for patient to call us back to schedule [place] with MLS.

## 2020-08-09 ENCOUNTER — Ambulatory Visit: Payer: BLUE CROSS/BLUE SHIELD

## 2020-08-10 ENCOUNTER — Telehealth: Payer: BLUE CROSS/BLUE SHIELD

## 2020-08-10 NOTE — Telephone Encounter
Patient left a voicemail on Rad Onc voicemail yesterday ([facility]), requesting an appointment with Dr. Rolla Etienne.  I left patient a voicemail this morning, asking him to call us back.

## 2020-08-13 ENCOUNTER — Telehealth: Payer: BLUE CROSS/BLUE SHIELD

## 2020-08-13 NOTE — Telephone Encounter
Left voicemail. Called to complete intake questions prior to rad onc appt.

## 2020-08-16 ENCOUNTER — Telehealth: Payer: PRIVATE HEALTH INSURANCE

## 2020-08-17 ENCOUNTER — Ambulatory Visit: Payer: BLUE CROSS/BLUE SHIELD

## 2020-08-17 ENCOUNTER — Ambulatory Visit: Payer: PRIVATE HEALTH INSURANCE

## 2020-08-18 ENCOUNTER — Inpatient Hospital Stay: Payer: BLUE CROSS/BLUE SHIELD | Attending: Radiation Oncology

## 2020-09-27 ENCOUNTER — Ambulatory Visit: Payer: BLUE CROSS/BLUE SHIELD

## 2020-10-01 ENCOUNTER — Ambulatory Visit: Payer: BLUE CROSS/BLUE SHIELD

## 2020-10-05 ENCOUNTER — Inpatient Hospital Stay: Payer: PRIVATE HEALTH INSURANCE

## 2020-10-05 ENCOUNTER — Inpatient Hospital Stay: Payer: BLUE CROSS/BLUE SHIELD | Attending: Radiation Oncology

## 2020-10-05 DIAGNOSIS — C61 Malignant neoplasm of prostate: Secondary | ICD-10-CM

## 2020-10-11 ENCOUNTER — Ambulatory Visit: Payer: BLUE CROSS/BLUE SHIELD | Attending: Radiation Oncology

## 2020-10-11 NOTE — Patient Instructions
RADIATION THERAPY TO THE MALE PELVIS- PATIENT EDUCATION      General Instructions:    ??? Many side effects including fatigue, pain/skin reaction on the treatment site, and nausea/vomiting, which you may experience during your treatment, should slowly improve a few weeks after completion of treatment.   ??? You may continue activity and diet as tolerated, unless otherwise noted.  ??? We have an in-house Registered Dietician and Child psychotherapist available if needed and/or upon request.   ??? Please talk to your Radiation Oncologist about taking supplements/vitamins during your treatment. Antioxidants are discouraged during radiation treatment.   ??? You may shower, but do not scrub the treated area, as your skin is healing. Avoid sun exposure to the treated area and use sunscreen. Your skin in the treatment area will be more sensitive to the sun, up to a year after treatment is completed.  ??? Use mild soaps and shampoos until skin is completely healed.  ??? In addition to chest pain, please go to the nearest emergency room if you are experiencing increase shortness of breath, passing out/ fainting, unusual or bad headache, sudden inability to speak, see, walk or move, sudden weakness or drooping, dizziness that does not go away, sudden confusion, heavy bleeding, excessive vomiting or diarrhea, severe pain, high fever or seizures.    Site Specific Symptoms: GU / PELVIS    You may experience the following symptoms during and/or after completion of treatment:    Abdominal cramping, tenesmus (recurrent inclination to evacuate the bowels) urgency, and frequency of defecation.    ??? They can usually be controlled with antidiarrheal agents or topical anti-inflammatory preparations. After radiation therapy is completed, acute symptoms usually resolve within three to eight weeks    Some patients may experience urinary symptoms, which may include frequent, painful or difficulty urinating, and/or urgency due to inflammation to the bladder or urethra.   ??? Symptoms typically resolve within four weeks after the completion of therapy.      Other Symptoms/Side Effects:     ??? Fatigue   ??? Decreased appetite   ??? Nausea   ??? Vomiting   ??? Diarrhea   ??? Fecal incontinence   ??? Skin changes (darkening, peeling, blistering, itching, or redness)   ??? Small amount of blood in the urine  ??? Inability to achieve and maintain an erection  ??? Decreased libido   ??? Delayed orgasm  ??? Unable to have an orgasm  ??? General pain      Radiation Therapy: Managing Short-Term Side Effects     Radiation therapy uses high-energy X-rays or particles to kill cancer cells. Some normal cells can also be affected. This causes side effects such as dry skin, tiredness (fatigue), or changes in your appetite. Most side effects go away when your radiation therapy is over but it may take 4 to 6 weeks to improve.      Having side effects of radiation therapy does not mean that your cancer is getting worse or that therapy isn???t working.     Caring for your skin  Skin problems may happen where your body gets radiation. Your skin may become dry, itchy, red, and start peeling. It may darken in that spot, like a tan. To care for your skin:   ??? Don???t scrub on the treatment area. Clean that area of the skin every day. Use warm water and mild soap, or as your healthcare provider advises. Pat the skin afterward or let it air dry.  ??? Ask your therapy  team what lotion to use and when to use it. And let them know if you are using any kind of lotion or moisturizers.  ??? Keep the treated area out of direct sunlight. Hats, protective clothing, and sunscreen are very helpful.  ??? Don't remove ink marks unless your radiation therapist says you can. Don???t scrub the marks when you wash. Let water run over them and pat them dry.  ??? Protect your skin from heat or cold. Don't use hot tubs, saunas, hot pads, or ice packs.  ??? Wear soft, loose clothing to keep skin from rubbing.    Fighting tiredness  The cancer itself or the radiation therapy may cause you to feel tired. Your body is working hard to heal and repair itself. To feel better:   ??? Try light exercise each day. Take short walks.  ??? Plan tasks for the times when you tend to have the most energy. Ask for help when you need it.  ??? Relax before you go to bed to sleep better. Try reading or listening to soothing music.  ??? Let your cancer care team know if you continue to have fatigue that is not getting better. They may be able to offer ways to help.     Coping with appetite changes  Tell your therapy team if you find it hard to eat or have no appetite. You may need to see a nutritionist. This is a healthcare provider with special training in meal planning. To keep your strength up, you need to eat well and maintain your weight. Think of healthy eating as part of your treatment. Try these tips:   ??? Eat slowly.  ??? Eat small meals several times a day.  ??? Eat more food when you???re feeling better, even if it is not mealtime.  ??? Ask others to keep you company when you eat.  ??? Stock up on easy-to-prepare foods.  ??? Eat foods high in protein and calories.  ??? Drink plenty of water and other fluids.  ??? Ask your healthcare provider before taking any vitamins.    Site-specific side effects  These side effects include the following:   ??? You may lose hair in the area being treated. The hair may not grow back after treatment.  ??? Your mouth or throat can become dry or sore if your head or neck is being treated. Sip cool water to help ease discomfort.  ??? Nausea and bowel changes can happen with radiation to the pelvic area. Tell your healthcare provider if you have nausea, diarrhea, or constipation. You may need to take medicine or follow a special diet.    Talk with your healthcare team  Radiation therapy can also have other side effects, including some that might not show up until years later. Talk with your healthcare team about what to expect with the type of radiation therapy you are getting, including when you should call them with concerns.      Supplements To Avoid During Treatment  Radiation works in part by creating free radicals ??? highly energized molecules that damage cancer cells, free radicals environment can damage all cells but in the case of radiation treatment they are focused on the cancer cells. Antioxidants help prevent or neutralize free radicals.  Due to the potential conflict between the goal of radiation therapy (to make free radicals to kill cancerous cells) and the goal of antioxidants (to neutralize free radicals), it is recommended that patients stop taking any antioxidant supplements during radiation therapy,  as they can reduce the effectiveness of your treatment. When radiation is finished, you can resume taking your supplements.  Throughout your treatment, do your best to eat a well-balanced diet of whole fruits, vegetables, and whole grains that contains all of the nutrients you need. Vitamins and minerals that come naturally from food are safe and not likely to interfere with treatment as long as they are consumed in reasonable doses.   During the course of your radiation therapy, avoid these supplements (pills):  ??? Vitamin A  ??? Vitamin C/Emergen-C/Airborne  ??? Vitamin E  ??? Selenium  ??? CoQ10  ??? Turmeric/Curcumin  ??? Ginger  ??? Lycopene  ??? Green Tea Capsules        Remember the symptoms or side effects mentioned above are usually temporary and begin to resolve a few weeks after treatment ends. It is advisable to continue skin care regimen as directed by your Radiation Oncologist and Radiation Oncology Nurses. If needed, there are medications that can be prescribed to make you more comfortable, so please do not hesitate to reach out to your Radiation Oncology team if you start to experience any of these symptoms.      If these symptoms do not resolve over time or you notice any new symptoms, such as heavy bleeding, a fever or flu-like symptoms, please let us know by calling us at [phone] (M-F, 8am-5pm) [place], [phone] [facility]), [phone] [facility] [place]) or [phone] (after hours and weekends, ask for Radiation Oncologist on-call) and go to the Emergency Department for further evaluation.?       To contact our Child psychotherapist on site, please call:    Janan Halter, LCSW, OSW-C  Clinical Social Worker III, Department of Radiation Oncology  [facility]  [address] [place], [address]  [place], [place] [address]  [phone]  [email or web address]     To contact our Dietitian/Nutritionist on site, please call:    Debarah Crape, MS, RDN  Registered Dietitian, Department Of Radiation Oncology  [facility]  [address] [place] [address]  [place], [place] [address]  [phone] (cell), [phone] (office), fax [phone]  [email or web address] ~

## 2020-10-12 ENCOUNTER — Inpatient Hospital Stay: Payer: BLUE CROSS/BLUE SHIELD | Attending: Radiation Oncology

## 2020-10-12 ENCOUNTER — Inpatient Hospital Stay: Payer: BLUE CROSS/BLUE SHIELD

## 2020-10-12 ENCOUNTER — Inpatient Hospital Stay: Payer: PRIVATE HEALTH INSURANCE | Attending: Radiation Oncology

## 2020-10-12 ENCOUNTER — Ambulatory Visit: Payer: PRIVATE HEALTH INSURANCE | Attending: Radiation Oncology

## 2020-10-12 NOTE — Nursing Note
Education material was provided to the patient regarding radiation therapy to the prostate and pelvic nodes and its side effects. All questions and concerns were addressed. Patient verbalized understanding well.

## 2020-10-16 ENCOUNTER — Inpatient Hospital Stay: Payer: BLUE CROSS/BLUE SHIELD | Attending: Radiation Oncology

## 2020-10-16 ENCOUNTER — Ambulatory Visit: Payer: BLUE CROSS/BLUE SHIELD | Attending: Radiation Oncology

## 2020-10-16 ENCOUNTER — Ambulatory Visit: Payer: PRIVATE HEALTH INSURANCE

## 2020-10-16 NOTE — Patient Instructions
1. Return Visit with:Dr. Ria Comment  2. Return to the Radiation Oncology Department in 3 months.  3. Scans:No scans needed at this time None needed  4. Labs needed: PSA, Testosterone prior to 3 month follow up  5.                      We look forward to seeing you next time. If you have any questions or concerns between now and your next visit, please feel free to contact us at   ??? [phone] (M-F, 8am-5pm) [place]  ??? [phone] (M-F, 8am-5pm) [facility]  ??? [phone] (M-F, 8am-5pm) [place]  ??? [phone] (after hours and weekends, ask for Radiation Oncologist on-call) and/or go to the Emergency Department for further evaluation.    Author:  Norina Buzzard, PA 10/16/2020 1:08 PM

## 2020-10-17 ENCOUNTER — Ambulatory Visit: Payer: BLUE CROSS/BLUE SHIELD | Attending: Radiation Oncology

## 2020-10-17 ENCOUNTER — Inpatient Hospital Stay: Payer: BLUE CROSS/BLUE SHIELD | Attending: Radiation Oncology

## 2020-10-18 ENCOUNTER — Inpatient Hospital Stay: Payer: BLUE CROSS/BLUE SHIELD | Attending: Radiation Oncology

## 2020-10-18 NOTE — Treatment Summary
RADIATION ONCOLOGY FINAL TREATMENT SUMMARY    ???PATIENT:  Timothy Merritt  ???MRN:  [id number]  ???DOB:  [birth year]    ???DATE OF SERVICE:  10/19/2020    ???REFERRING PRACTITIONER: Fraser Din, MD  ???PRIMARY CARE PROVIDER: No primary care provider on file.  ???RESIDENT PHYSICIAN: Norina Buzzard, PA  ???ATTENDING PHYSICIAN: Casimiro Needle L. Ria Comment, MD     ???CLINICAL SUMMARY: Timothy Merritt is a 60 y.o. 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 PSA decreased to 4.94 on 11/04/2018 but rose to 10.45 on 07/07/2019, with repeat MRI fusion biopsy on 07/07/2019 showing up to GS 3+4=7 disease (total of 5/6 systematic cores and 4/4 targeted cores positive), continued on surveillance. MRI prostate from 06/12/2020 revealed involvement of the left seminal vesicle involvement and right NV bundle. Overall concerning for high-risk disease (iT3b, iPSA 9.1, GS 3+4 = 7).   ???DIAGNOSIS: high risk prostate cancer  ???RADIATION HISTORY:   1. 10/12/20-10/19/20: SBRT prostate 40 Gy/5 fractions    ???DATA OF INTERVAL HISTORY:  ??? Treatment Data Fields ~ Data ~ Comments    ??? T stage ~ T3b ~     ??? N stage ~ N0 ~     ??? M stage ~ M0 ~     ??? Case Type ~ Definitive ~     ??? More than 1 site treated? ~ No ~ List sites:    ??? Re-irradiation? ~ No ~     ??? Clinical Trial? ~ No ~ If applicable, describe trial:              ??? For definitive cases:        ??? Treatment Setting (RT) ~ RT alone ~     ??? Surgery (if applicable) ~ No surgery ~     ??? Systemic Therapy (if applicable) ~ Hormonal therapy ~     ??? Systemic Therapy Setting (if applicable) ~ Concomitant ~     ??? Other Systemic Therapy (if more than one) ~ ---- ~       ???RADIATION THERAPY DETAILS:  ??? Treatment Data Fields ~ Data    ??? Treatment Location: ~ [facility]    ??? Treatment concluded as planned: ~ Yes    ??? Details if treatment NOT concluded as planned:  ~     ??? Start Dates (m/d/yyyy): ~ 10/12/20    ??? End Dates: (m/d/yyyy): ~ 10/19/20    ??? Treatment Site: ~ Prostate, seminal vesicles and pelvic lymph nodes    ??? Total Dose (Gy): ~ 40 Gy    ??? Number of fractions (indicate frequency if other than daily): ~ 5 fractions     ??? Elapsed Time (days):  ~ 7    ??? Energy: ~ 6 MV    ??? Technique: ~ SBRT ???         Response to Treatment (Disease): Too early to evaluate    Tolerance to Treatment: Timothy Merritt tolerated radiation treatment well without significant adverse effects.  He completed treatment without interruption.       Disposition:         The patient is asked to follow-up with Dr. Ria Comment in 3 months.    cc Patient Care Team:  Fraser Din, MD (Medicine, Hematology & Oncology)  Altamese [place]., MD as Consult - Attending (Radiation Oncology)  Jarold Song, RN  as Registered Nurse (Radiation Oncology)       Author:  Avelino Leeds. Max 10/18/2020 3:06 PM

## 2020-10-19 ENCOUNTER — Inpatient Hospital Stay: Payer: PRIVATE HEALTH INSURANCE | Attending: Radiation Oncology

## 2020-10-19 ENCOUNTER — Inpatient Hospital Stay: Payer: BLUE CROSS/BLUE SHIELD | Attending: Radiation Oncology

## 2020-10-19 ENCOUNTER — Ambulatory Visit: Payer: PRIVATE HEALTH INSURANCE | Attending: Radiation Oncology

## 2020-10-22 ENCOUNTER — Ambulatory Visit: Payer: BLUE CROSS/BLUE SHIELD | Attending: Radiation Oncology

## 2020-10-23 ENCOUNTER — Telehealth: Payer: BLUE CROSS/BLUE SHIELD

## 2020-10-23 NOTE — Telephone Encounter
Spoke with Chelsea Primus for post-RT follow up. Patient c/o urinary and bowel frequency, but says symptoms are ''controlled.'' Notes weak stream, can't take ibuprofen - on blood thinners.  Taking two Flomax p.m., nocturia 2-3x/night.    Energy level good, hiked 6 miles today. Encouraged patient to reach out if he does not see improvement in next couple of weeks.  Patient expressed appreciation for call and for the care he received in RadOnc.

## 2020-10-30 ENCOUNTER — Ambulatory Visit: Payer: PRIVATE HEALTH INSURANCE

## 2020-10-30 NOTE — Progress Notes
RADIATION ONCOLOGY BRIEF NOTE     10/30/2020       Otoniel Devaul Tomas previously following with Dr. Elissa Hefty but insurance no longer accepted. Will refer to medical oncologist at [facility] for continuation of advanced ADT.       Orvis Brill, MD  Radiation Oncology Resident, PGY3  [phone]    [facility] [place]. Erian Lariviere 10/30/2020 8:50 AM

## 2020-11-14 ENCOUNTER — Telehealth: Payer: PRIVATE HEALTH INSURANCE

## 2020-11-14 NOTE — Progress Notes
RADIATION ONCOLOGY BRIEF NOTE     11/14/2020       Timothy Merritt Timothy Merritt reached out with nocturia x4 and dysuria. He completed SBRT on 10/19/20. Taking Flomax. Advised him to try ibuprofen 600 mg TID too.       Timothy Neptune, MD  Radiation Oncology Resident, PGY3  [phone]    Timothy Merritt. Timothy Merritt 11/14/2020 12:09 PM

## 2020-11-15 MED ORDER — PHENAZOPYRIDINE HCL 200 MG PO TABS
200 mg | ORAL_TABLET | Freq: Three times a day (TID) | ORAL | 0 refills | Status: AC
Start: 2020-11-15 — End: ?

## 2020-11-30 ENCOUNTER — Ambulatory Visit: Payer: PRIVATE HEALTH INSURANCE

## 2020-11-30 MED ORDER — PHENAZOPYRIDINE HCL 200 MG PO TABS
ORAL_TABLET | 0 refills
Start: 2020-11-30 — End: ?

## 2020-11-30 MED ORDER — PHENAZOPYRIDINE HCL 200 MG PO TABS
200 mg | ORAL_TABLET | Freq: Three times a day (TID) | ORAL | 2 refills | Status: AC
Start: 2020-11-30 — End: ?

## 2020-11-30 MED ORDER — PHENAZOPYRIDINE HCL 200 MG PO TABS
200 mg | ORAL_TABLET | Freq: Three times a day (TID) | ORAL | 2 refills | Status: AC
Start: 2020-11-30 — End: 2020-12-01

## 2020-11-30 NOTE — Addendum Note
Addended by: Orvis Brill C on: 11/30/2020 11:43 AM     Modules accepted: Orders

## 2021-01-11 ENCOUNTER — Telehealth: Payer: BLUE CROSS/BLUE SHIELD

## 2021-01-11 NOTE — Telephone Encounter
Left voicemail and advised patient to call back for intake prior to visit.

## 2021-01-21 ENCOUNTER — Ambulatory Visit: Payer: PRIVATE HEALTH INSURANCE

## 2021-01-22 NOTE — Progress Notes
RADIATION ONCOLOGY PROSTATE FOLLOW-UP VISIT NOTE    ???PATIENT: Timothy Merritt  ???MRN: [id number]  ???DOB: [birth year]     ???DATE OF SERVICE: 01/23/2021    ???REFERRING PRACTITIONER: Fraser Din, MD  ???PRIMARY CARE PROVIDER: No primary care provider on file.  ???RESIDENT PHYSICIAN: Kermit Balo. Ferd Glassing, MD  ???ATTENDING PHYSICIAN: Casimiro Needle L. Ria Comment, MD     ???CHIEF COMPLAINT: 3 month follow-up since COT    ???IDENTIFYING DATA/ONCOLOGIC HISTORY: Timothy Merritt is a 60 y.o. 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 PSA decreased to 4.94 on 11/04/2018 but rose to 10.45 on 07/07/2019, with repeat MRI fusion biopsy on 07/07/2019 showing up to GS 3+4=7 disease (total of 5/6 systematic cores and 4/4 targeted cores positive), continued on surveillance. MRI prostate from 06/12/2020 revealed involvement of the left seminal vesicle involvement and right NV bundle, overall concerning for high-risk disease (iT3b, iPSA 11.5, GS 3+4 = 7), s/p SBRT to the prostate/SVs and pelvic lymph nodes completed 10/19/2020. On relugolix and abiraterone (started 05/2020) with plan for 1 year of ADT.    ???STAGE: Cancer Staging  Prostate cancer (HCC/RAF)  Staging form: Prostate, AJCC 8th Edition  - Clinical stage from 07/31/2020: Stage IIIB (cT3b, cN0, cM0, PSA: 11.5, Grade Group: 2) - Signed by Lang Snow., MD on 01/22/2021    ???RADIATION HISTORY:   10/12/2020-10/19/2020: SBRT 40 Gy in 5 fractions to the prostate and proximal seminal vesicles and 25 Gy in 5 fractions to the pelvic lymph nodes    ???DATA OF INTERVAL HISTORY:  ??? Prostate-FU Data Fields ~ Data ~ Comments    ??? Most recent PSA level ~ <0.006 ~     ??? Recent PSA date (m/d/yyyy) ~ 01/14/2021 ~     ??? Biochemical Failure (BF) ~ ---- ~     ??? If BF, date (m/d/yyyy) ~  ~     ??? Local Failure (LF) ~ ---- ~     ??? If LF, date (m/d/yyyy) ~  ~     ??? Regional failure (RF) ~ ---- ~     ??? If RF, date (m/d/yyyy) ~  ~     ??? Distant Mets (DM) ~ ---- ~     ??? If DM, date (m/d/yyyy) ~  ~     ??? Any failure within RT field? ~ ---- ~     ??? RT related complications ~ Yes ~     ??? Complications ~  ~     ??? Highest complication grade (CTCAE) ~ 1 ~ Dysuria, nocturia ???     Subjective:       Interval History: Timothy Merritt presents for first follow-up since completion of radiation therapy 3 months ago. He completed treatment without interruption.    On 11/14/2020, he called our office with the complaint of nocturia 4x/night and dysuria. He was continuing on Flomax 1-2 pills a day. Given that he was on rivaroxaban and couldn't take NSAIDs, we recommended Pyridium for his dysuria.    On 01/14/2021, PSA was <0.006 and testosterone was <3.    Today, he reports feeling overall very well and improved compared to 1 month after SBRT. His urinary symptoms are improving, with improved urinary frequency and resolved dysuria. He has nocturia 3x/night. He continues on Flomax 0.4 mg nightly as well as Pyridium. He remains active, cycling 5 days a week, 30-40  miles a day. His energy is improving.     Current Toxicity  Anorexia: Grade 0  Anxiety: Grade 0  Bloating: Grade 0  Constipation: Grade 0  Dermatitis Radiation: Grade 0  Diarrhea: Grade 0  Fatigue: Grade 0  Fecal Incontinence: Grade 0  Flatulence: Grade 0  Nausea: Grade 0  Pain: Grade 0  Skin Hyperpigmentation: Grade 0  Urinary Frequency: Grade 1: Present  Urinary Incontinence: Grade 0  Urinary Tract Pain: Grade 0  Urinary Urgency: Grade 1: Present  Vomiting: Grade 0       Objective:      Physical Exam:  There were no vitals taken for this visit.     ECOG - [place] Cooperative Oncology Group performance status: ECOG Score - 0 - Fully active, able to carry on all pre-disease performance without restriction.      Physical Exam  Constitutional:       General: He is not in acute distress.     Appearance: He is well-developed.   HENT:      Head: Normocephalic and atraumatic.   Eyes:      General: No scleral icterus.        Right eye: No discharge.         Left eye: No discharge.   Pulmonary:      Effort: Pulmonary effort is normal. No respiratory distress.      Breath sounds: No stridor.   Musculoskeletal:         General: No deformity.      Cervical back: Normal range of motion.   Neurological:      Mental Status: He is alert and oriented to person, place, and time.   Psychiatric:         Mood and Affect: Mood normal.         Behavior: Behavior normal.         Thought Content: Thought content normal.         Judgment: Judgment normal.       Lab / Pathology / Radiology:        Assessment:      Timothy Merritt is a 60 y.o. 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 PSA decreased to 4.94 on 11/04/2018 but rose to 10.45 on 07/07/2019, with repeat MRI fusion biopsy on 07/07/2019 showing up to GS 3+4=7 disease (total of 5/6 systematic cores and 4/4 targeted cores positive), continued on surveillance. MRI prostate from 06/12/2020 revealed involvement of the left seminal vesicle involvement and right NV bundle, overall concerning for high-risk disease (iT3b, iPSA 11.5, GS 3+4 = 7), s/p SBRT to the prostate/SVs and pelvic lymph nodes completed 10/19/2020. On relugolix and abiraterone (started 05/2020) with plan for 1 year of ADT.       Plan/ Recommendation:      Timothy Merritt appears to be doing well with improving acute radiation toxicity. He has no clinical or biochemical signs of disease recurrence. He is on dual ADT with Dr. Fabio Asa and is tolerating it well. His recent PSA was undetectable.    Recommended that he continue Flomax (around dinnertime) given his frequency and that he can discontinue Pyridium whenever he likes given his resolved dysuria.    PLAN:  - Return for follow-up in 9 months  - Repeat PSA, Testosterone per Dr. Fabio Asa    Moderate level of Medical  Decision Making: (at least 2 of below)  [x]  Moderate complexity of problems addressed. Prostate cancer. This is a chronic illness/condition with exacerbation, progression, or side effects of treatment.  [x]  Moderate amount/complexity of data personally reviewed and analyzed by me (at least 1 of below)   ??? []  Category 1 (tests, documents, independent historians): N/A  ??? [x]  Category 2 (independent interpretation of tests): PSA [dates: 01/14/2021]  ??? []  Category 3 (discussion of management or test interpretation): N/A  [x]  Moderate risk of complications/morbidity/mortality of treatment due to inherent risk of radiation therapy and the monitoring involved  Total time: I spent 30 minutes on the day of service of face-to-face and non-face-to-face time, of which greater than 20 minutes was spent in counseling and coordination of care and a detailed question and answer session, in addition to:  [x]  Face-to-face and non-face-to-face time spent with the patient  [x]  Preparing to see the patient (e.g. review of tests, imaging)  [x]  Obtaining and/or reviewing separately obtained history  [x]  Performing a medically appropriate exam and evaluation  [x]  Counseling and education with the patient and family/caregiver  []  Ordering medications, tests, procedures  [x]  Referring or communicating with other healthcare professionals  [x]  Documenting clinical information in the EHR  [x]  Independently interpreting results and communicating results to patient/family/caregiver         cc Patient Care Team:  Fraser Din, MD (Medicine, Hematology & Oncology)  Altamese [place]., MD as Consult - Attending (Radiation Oncology)  Guerry Minors, RN as Registered Nurse (Radiation Oncology)      Author: Kermit Balo. Ferd Glassing 01/22/2021 4:35 PM

## 2021-01-23 ENCOUNTER — Inpatient Hospital Stay: Payer: BLUE CROSS/BLUE SHIELD | Attending: Radiation Oncology

## 2021-01-23 NOTE — Addendum Note
Encounter addended by: Joycelyn Das., MD on: 01/23/2021 12:20 PM   Actions taken: Clinical Note Signed

## 2022-06-14 IMAGING — MR MR PROSTATE WO/W CM
56 series · 56 of 56 positions shown · IV contrast (Multihance 16ml)
Comparison: None available

CLINICAL DATA: 58-year-old male with prostate cancer diagnosed
8753. Patient status post HIFU therapy.

EXAM:
MR PROSTATE WITHOUT AND WITH CONTRAST
TECHNIQUE: Multiplanar multisequence MRI images were obtained of the pelvis
centered about the prostate. Pre and post contrast images were
obtained.
CONTRAST:  16mL MULTIHANCE GADOBENATE DIMEGLUMINE 529 MG/ML IV SOLN

[Series 3: bSSFP fat-sat · axial · 8.0mm · 0.74mm/px · 1 of 28 slices shown]
[im 1/28]
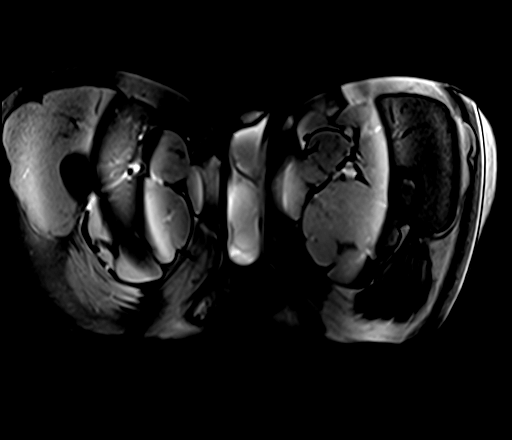

[Series 4: T1 · axial · 5.0mm · 1.25mm/px · 1 of 88 slices shown]
[im 1/88]
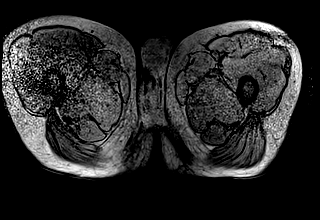

[Series 5: DWI · axial · 3.5mm · 1.75mm/px · 1 of 114 slices shown (1 of 3)]
[im 1/114]
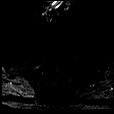

[Series 6: DWI · axial · 3.5mm · 1.75mm/px · 1 of 40 slices shown (2 of 3)]
[im 1/40]
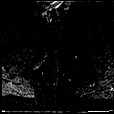

[Series 7: DWI · axial · 3.5mm · 1.56mm/px · 1 of 40 slices shown (3 of 3)]
[im 1/40]
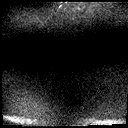

[Series 8: T2 · coronal · 3.5mm · 0.56mm/px · 1 of 40 slices shown (1 of 3)]
[im 1/40]
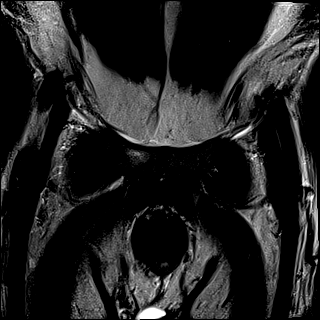

[Series 9: T2 · axial · 3.5mm · 0.56mm/px · 1 of 35 slices shown (2 of 3)]
[im 1/35]
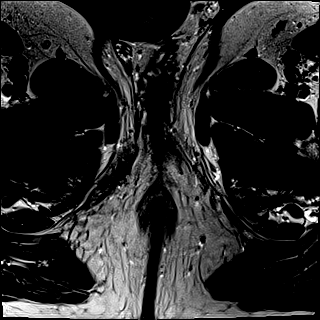

[Series 10: T2 · axial · 1.0mm · 1.04mm/px · 1 of 96 slices shown (3 of 3)]
[im 1/96]
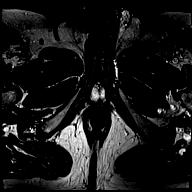

[Series 11: pre t1_twist_tra_dyn_ttc=6.4s · axial · non-contrast · 3.5mm · 0.83mm/px · 1 of 26 slices shown]
[im 1/26]
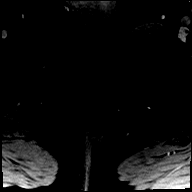

[Series 12: post t1_twist_tra_dyn-copy center · axial · 3.5mm · 0.83mm/px · 1 of 26 slices shown (1 of 24)]
[im 1/26]
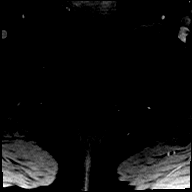

[Series 13: post t1_twist_tra_dyn-copy center · axial · 3.5mm · 0.83mm/px · 1 of 26 slices shown (2 of 24)]
[im 1/26]
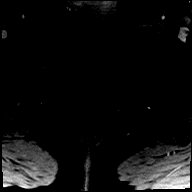

[Series 14: post t1_twist_tra_dyn-copy cent_sub_ttc=(id) · axial · 3.5mm · 0.83mm/px · 1 of 24 slices shown (1 of 23)]
[im 1/24]
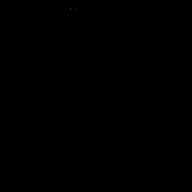

[Series 15: post t1_twist_tra_dyn-copy center · axial · 3.5mm · 0.83mm/px · 1 of 26 slices shown (3 of 24)]
[im 1/26]
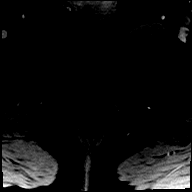

[Series 16: post t1_twist_tra_dyn-copy cent_sub_ttc=(id) · axial · 3.5mm · 0.83mm/px · 1 of 26 slices shown (2 of 23)]
[im 1/26]
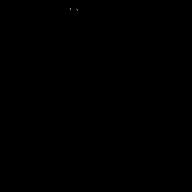

[Series 17: post t1_twist_tra_dyn-copy center · axial · 3.5mm · 0.83mm/px · 1 of 26 slices shown (4 of 24)]
[im 1/26]
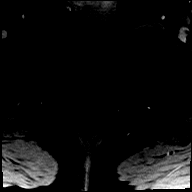

[Series 18: post t1_twist_tra_dyn-copy cent_sub_ttc=(id) · axial · 3.5mm · 0.83mm/px · 1 of 26 slices shown (3 of 23)]
[im 1/26]
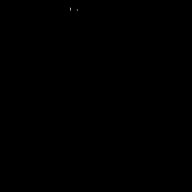

[Series 19: post t1_twist_tra_dyn-copy center · axial · 3.5mm · 0.83mm/px · 1 of 26 slices shown (5 of 24)]
[im 1/26]
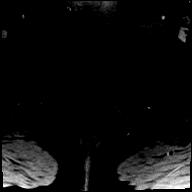

[Series 20: post t1_twist_tra_dyn-copy cent_sub_ttc=(id) · axial · 3.5mm · 0.83mm/px · 1 of 26 slices shown (4 of 23)]
[im 1/26]
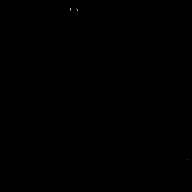

[Series 21: post t1_twist_tra_dyn-copy center · axial · 3.5mm · 0.83mm/px · 1 of 26 slices shown (6 of 24)]
[im 1/26]
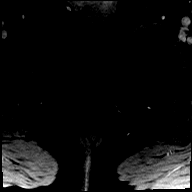

[Series 22: post t1_twist_tra_dyn-copy cent_sub_ttc=(id) · axial · 3.5mm · 0.83mm/px · 1 of 26 slices shown (5 of 23)]
[im 1/26]
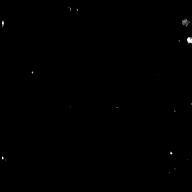

[Series 23: post t1_twist_tra_dyn-copy center · axial · 3.5mm · 0.83mm/px · 1 of 26 slices shown (7 of 24)]
[im 1/26]
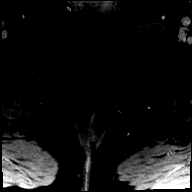

[Series 24: post t1_twist_tra_dyn-copy cent_sub_ttc=(id) · axial · 3.5mm · 0.83mm/px · 1 of 26 slices shown (6 of 23)]
[im 1/26]
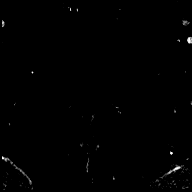

[Series 25: post t1_twist_tra_dyn-copy center · axial · 3.5mm · 0.83mm/px · 1 of 26 slices shown (8 of 24)]
[im 1/26]
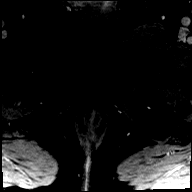

[Series 26: post t1_twist_tra_dyn-copy cent_sub_ttc=(id) · axial · 3.5mm · 0.83mm/px · 1 of 26 slices shown (7 of 23)]
[im 1/26]
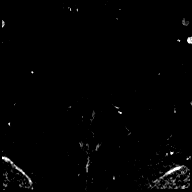

[Series 27: post t1_twist_tra_dyn-copy center · axial · 3.5mm · 0.83mm/px · 1 of 26 slices shown (9 of 24)]
[im 1/26]
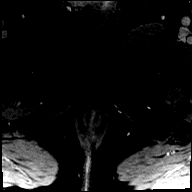

[Series 28: post t1_twist_tra_dyn-copy cent_sub_ttc=(id) · axial · 3.5mm · 0.83mm/px · 1 of 26 slices shown (8 of 23)]
[im 1/26]
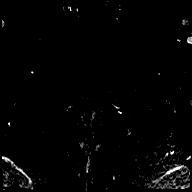

[Series 29: post t1_twist_tra_dyn-copy center · axial · 3.5mm · 0.83mm/px · 1 of 26 slices shown (10 of 24)]
[im 1/26]
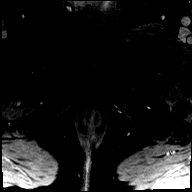

[Series 30: post t1_twist_tra_dyn-copy cent_sub_ttc=(id) · axial · 3.5mm · 0.83mm/px · 1 of 26 slices shown (9 of 23)]
[im 1/26]
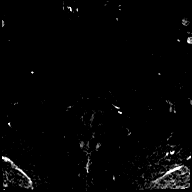

[Series 31: post t1_twist_tra_dyn-copy center · axial · 3.5mm · 0.83mm/px · 1 of 26 slices shown (11 of 24)]
[im 1/26]
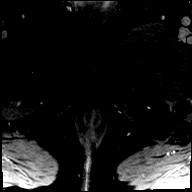

[Series 32: post t1_twist_tra_dyn-copy cent_sub_ttc=(id) · axial · 3.5mm · 0.83mm/px · 1 of 26 slices shown (10 of 23)]
[im 1/26]
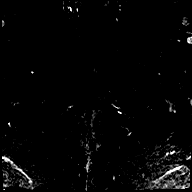

[Series 33: post t1_twist_tra_dyn-copy center · axial · 3.5mm · 0.83mm/px · 1 of 26 slices shown (12 of 24)]
[im 1/26]
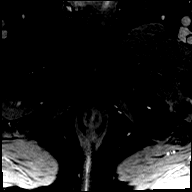

[Series 34: post t1_twist_tra_dyn-copy cent_sub_ttc=(id) · axial · 3.5mm · 0.83mm/px · 1 of 26 slices shown (11 of 23)]
[im 1/26]
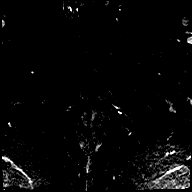

[Series 35: post t1_twist_tra_dyn-copy center · axial · 3.5mm · 0.83mm/px · 1 of 26 slices shown (13 of 24)]
[im 1/26]
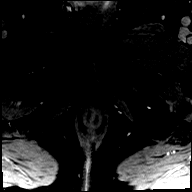

[Series 36: post t1_twist_tra_dyn-copy cent_sub_ttc=(id) · axial · 3.5mm · 0.83mm/px · 1 of 26 slices shown (12 of 23)]
[im 1/26]
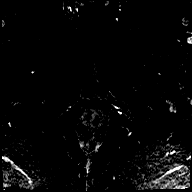

[Series 37: post t1_twist_tra_dyn-copy center · axial · 3.5mm · 0.83mm/px · 1 of 26 slices shown (14 of 24)]
[im 1/26]
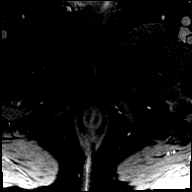

[Series 38: post t1_twist_tra_dyn-copy cent_sub_ttc=(id) · axial · 3.5mm · 0.83mm/px · 1 of 26 slices shown (13 of 23)]
[im 1/26]
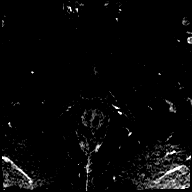

[Series 39: post t1_twist_tra_dyn-copy center · axial · 3.5mm · 0.83mm/px · 1 of 26 slices shown (15 of 24)]
[im 1/26]
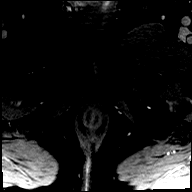

[Series 40: post t1_twist_tra_dyn-copy cent_sub_ttc=(id) · axial · 3.5mm · 0.83mm/px · 1 of 26 slices shown (14 of 23)]
[im 1/26]
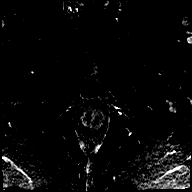

[Series 41: post t1_twist_tra_dyn-copy center · axial · 3.5mm · 0.83mm/px · 1 of 26 slices shown (16 of 24)]
[im 1/26]
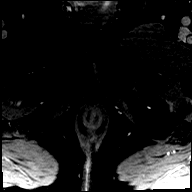

[Series 42: post t1_twist_tra_dyn-copy cent_sub_ttc=(id) · axial · 3.5mm · 0.83mm/px · 1 of 25 slices shown (15 of 23)]
[im 1/25]
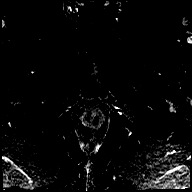

[Series 43: post t1_twist_tra_dyn-copy center · axial · 3.5mm · 0.83mm/px · 1 of 26 slices shown (17 of 24)]
[im 1/26]
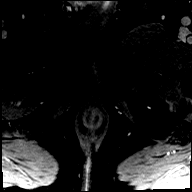

[Series 44: post t1_twist_tra_dyn-copy cent_sub_ttc=(id) · axial · 3.5mm · 0.83mm/px · 1 of 26 slices shown (16 of 23)]
[im 1/26]
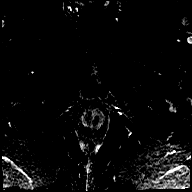

[Series 45: post t1_twist_tra_dyn-copy center · axial · 3.5mm · 0.83mm/px · 1 of 26 slices shown (18 of 24)]
[im 1/26]
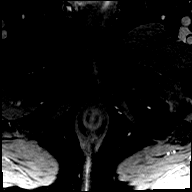

[Series 46: post t1_twist_tra_dyn-copy cent_sub_ttc=(id) · axial · 3.5mm · 0.83mm/px · 1 of 26 slices shown (17 of 23)]
[im 1/26]
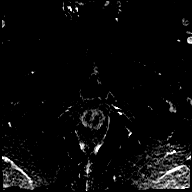

[Series 47: post t1_twist_tra_dyn-copy center · axial · 3.5mm · 0.83mm/px · 1 of 26 slices shown (19 of 24)]
[im 1/26]
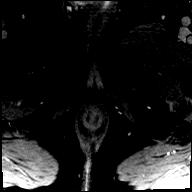

[Series 48: post t1_twist_tra_dyn-copy cent_sub_ttc=(id) · axial · 3.5mm · 0.83mm/px · 1 of 26 slices shown (18 of 23)]
[im 1/26]
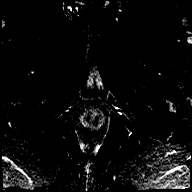

[Series 49: post t1_twist_tra_dyn-copy center · axial · 3.5mm · 0.83mm/px · 1 of 26 slices shown (20 of 24)]
[im 1/26]
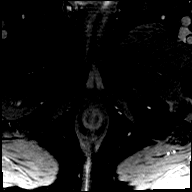

[Series 50: post t1_twist_tra_dyn-copy cent_sub_ttc=(id) · axial · 3.5mm · 0.83mm/px · 1 of 26 slices shown (19 of 23)]
[im 1/26]
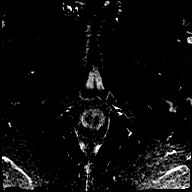

[Series 51: post t1_twist_tra_dyn-copy center · axial · 3.5mm · 0.83mm/px · 1 of 26 slices shown (21 of 24)]
[im 1/26]
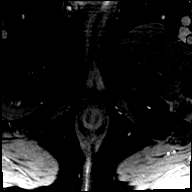

[Series 52: post t1_twist_tra_dyn-copy cent_sub_ttc=(id) · axial · 3.5mm · 0.83mm/px · 1 of 26 slices shown (20 of 23)]
[im 1/26]
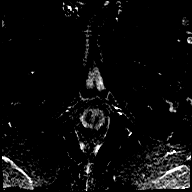

[Series 53: post t1_twist_tra_dyn-copy center · axial · 3.5mm · 0.83mm/px · 1 of 26 slices shown (22 of 24)]
[im 1/26]
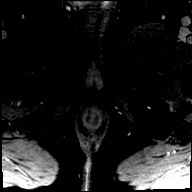

[Series 54: post t1_twist_tra_dyn-copy cent_sub_ttc=(id) · axial · 3.5mm · 0.83mm/px · 1 of 26 slices shown (21 of 23)]
[im 1/26]
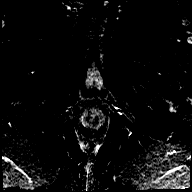

[Series 55: post t1_twist_tra_dyn-copy center · axial · 3.5mm · 0.83mm/px · 1 of 26 slices shown (23 of 24)]
[im 1/26]
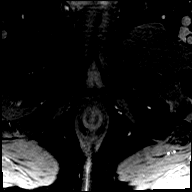

[Series 56: post t1_twist_tra_dyn-copy cent_sub_ttc=(id) · axial · 3.5mm · 0.83mm/px · 1 of 26 slices shown (22 of 23)]
[im 1/26]
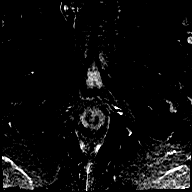

[Series 57: post t1_twist_tra_dyn-copy center · axial · 3.5mm · 0.83mm/px · 1 of 26 slices shown (24 of 24)]
[im 1/26]
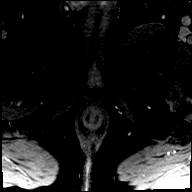

[Series 58: post t1_twist_tra_dyn-copy cent_sub_ttc=(id) · axial · 3.5mm · 0.83mm/px · 1 of 26 slices shown (23 of 23)]
[im 1/26]
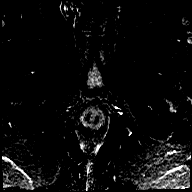

[56 of 56 positions shown; findings below may reference images not displayed]

FINDINGS: Prostate: Asymmetric volumes in the peripheral zone with the LEFT
peripheral zone decreased in volume compared to the RIGHT. There is
bilobed cystic complex in the RIGHT peripheral zone likely related
to prior therapy. No suspicious lesions in the peripheral zone on T2
weighted imaging. No foci of of restricted diffusion. Postcontrast
T1 weighted imaging demonstrates enhancement within the transitional
zone which is typical. No abnormal enhancement in the peripheral
zone.

No extra prostatic carcinoma identified.  Seminal vesicles normal.

Volume: 2.7 x 3.5 x 3.0 cm

Transcapsular spread:  Absent

Seminal vesicle involvement: Absent

Neurovascular bundle involvement: Not identified

Pelvic adenopathy: None

Bone metastasis: None

Other findings:
IMPRESSION: 1. No evidence high-grade prostate cancer local recurrence within
the prostate gland.
2. No evidence of extracapsular local prostate cancer or prostate
cancer metastasis within the pelvis.
3. Asymmetric volumes of the peripheral with volume loss and cystic
change in the in the LEFT peripheral zone presumably related to
prior HIFU therapy.
